# Patient Record
Sex: Male | Born: 1950 | Race: White | Hispanic: No | Marital: Single | Smoking: Never smoker
Health system: Southern US, Community
[De-identification: ages and names within clinical notes are randomized; demographics above are authoritative.]

## PROBLEM LIST (undated history)

## (undated) DIAGNOSIS — E119 Type 2 diabetes mellitus without complications: Secondary | ICD-10-CM

## (undated) DIAGNOSIS — E7800 Pure hypercholesterolemia, unspecified: Secondary | ICD-10-CM

## (undated) DIAGNOSIS — I10 Essential (primary) hypertension: Secondary | ICD-10-CM

---

## 2020-02-06 ENCOUNTER — Emergency Department (HOSPITAL_COMMUNITY): Payer: Medicare Other

## 2020-02-06 ENCOUNTER — Encounter (HOSPITAL_COMMUNITY): Payer: Self-pay | Admitting: *Deleted

## 2020-02-06 ENCOUNTER — Other Ambulatory Visit: Payer: Self-pay

## 2020-02-06 ENCOUNTER — Emergency Department (HOSPITAL_COMMUNITY)
Admission: EM | Admit: 2020-02-06 | Discharge: 2020-02-07 | Disposition: A | Discharge status: Other Acute Inpatient Hospital | Payer: Medicare Other | Attending: Emergency Medicine | Admitting: Emergency Medicine

## 2020-02-06 DIAGNOSIS — R600 Localized edema: Secondary | ICD-10-CM | POA: Diagnosis not present

## 2020-02-06 DIAGNOSIS — Z79899 Other long term (current) drug therapy: Secondary | ICD-10-CM | POA: Insufficient documentation

## 2020-02-06 DIAGNOSIS — Z9889 Other specified postprocedural states: Secondary | ICD-10-CM | POA: Diagnosis not present

## 2020-02-06 DIAGNOSIS — R509 Fever, unspecified: Secondary | ICD-10-CM | POA: Insufficient documentation

## 2020-02-06 DIAGNOSIS — E119 Type 2 diabetes mellitus without complications: Secondary | ICD-10-CM | POA: Diagnosis not present

## 2020-02-06 DIAGNOSIS — R112 Nausea with vomiting, unspecified: Secondary | ICD-10-CM | POA: Insufficient documentation

## 2020-02-06 DIAGNOSIS — R7989 Other specified abnormal findings of blood chemistry: Secondary | ICD-10-CM | POA: Insufficient documentation

## 2020-02-06 DIAGNOSIS — I10 Essential (primary) hypertension: Secondary | ICD-10-CM | POA: Insufficient documentation

## 2020-02-06 DIAGNOSIS — Z7984 Long term (current) use of oral hypoglycemic drugs: Secondary | ICD-10-CM | POA: Insufficient documentation

## 2020-02-06 DIAGNOSIS — Z20822 Contact with and (suspected) exposure to covid-19: Secondary | ICD-10-CM | POA: Insufficient documentation

## 2020-02-06 HISTORY — DX: Essential (primary) hypertension: I10

## 2020-02-06 HISTORY — DX: Pure hypercholesterolemia, unspecified: E78.00

## 2020-02-06 HISTORY — DX: Type 2 diabetes mellitus without complications: E11.9

## 2020-02-06 LAB — URINALYSIS, ROUTINE W REFLEX MICROSCOPIC
Bacteria, UA: NONE SEEN
Bilirubin Urine: NEGATIVE
Glucose, UA: >=500 mg/dL — AB
Hgb urine dipstick: NEGATIVE
Ketones, ur: 20 mg/dL — AB
Nitrite: NEGATIVE
Protein, ur: 30 mg/dL — AB
Specific Gravity, Urine: 1.015 (ref 1.005–1.030)
pH: 7 (ref 5.0–8.0)

## 2020-02-06 LAB — COMPREHENSIVE METABOLIC PANEL
ALT: 48 U/L — ABNORMAL HIGH (ref 0–44)
AST: 67 U/L — ABNORMAL HIGH (ref 15–41)
Albumin: 2.8 g/dL — ABNORMAL LOW (ref 3.5–5.0)
Alkaline Phosphatase: 118 U/L (ref 38–126)
Anion gap: 10 (ref 5–15)
BUN: 12 mg/dL (ref 8–23)
CO2: 28 mmol/L (ref 22–32)
Calcium: 8.5 mg/dL — ABNORMAL LOW (ref 8.9–10.3)
Chloride: 99 mmol/L (ref 98–111)
Creatinine, Ser: 1.07 mg/dL (ref 0.61–1.24)
GFR calc Af Amer: >60 mL/min (ref >60)
GFR calc non Af Amer: >60 mL/min (ref >60)
Glucose, Bld: 143 mg/dL — ABNORMAL HIGH (ref 70–99)
Potassium: 3.9 mmol/L (ref 3.5–5.1)
Sodium: 137 mmol/L (ref 135–145)
Total Bilirubin: 1.2 mg/dL (ref 0.3–1.2)
Total Protein: 6.8 g/dL (ref 6.5–8.1)

## 2020-02-06 LAB — CBC WITH DIFFERENTIAL/PLATELET
Abs Immature Granulocytes: 0.18 10*3/uL — ABNORMAL HIGH (ref 0.00–0.07)
Basophils Absolute: 0 10*3/uL (ref 0.0–0.1)
Basophils Relative: 0 %
Eosinophils Absolute: 0.2 10*3/uL (ref 0.0–0.5)
Eosinophils Relative: 1 %
HCT: 31.6 % — ABNORMAL LOW (ref 39.0–52.0)
Hemoglobin: 9.9 g/dL — ABNORMAL LOW (ref 13.0–17.0)
Immature Granulocytes: 1 %
Lymphocytes Relative: 4 %
Lymphs Abs: 0.6 10*3/uL — ABNORMAL LOW (ref 0.7–4.0)
MCH: 29.4 pg (ref 26.0–34.0)
MCHC: 31.3 g/dL (ref 30.0–36.0)
MCV: 93.8 fL (ref 80.0–100.0)
Monocytes Absolute: 1.3 10*3/uL — ABNORMAL HIGH (ref 0.1–1.0)
Monocytes Relative: 8 %
Neutro Abs: 13 10*3/uL — ABNORMAL HIGH (ref 1.7–7.7)
Neutrophils Relative %: 86 %
Platelets: 260 10*3/uL (ref 150–400)
RBC: 3.37 MIL/uL — ABNORMAL LOW (ref 4.22–5.81)
RDW: 14.3 % (ref 11.5–15.5)
WBC: 15.3 10*3/uL — ABNORMAL HIGH (ref 4.0–10.5)
nRBC: 0 % (ref 0.0–0.2)

## 2020-02-06 LAB — APTT: aPTT: 33 seconds (ref 24–36)

## 2020-02-06 LAB — PROTIME-INR
INR: 1 (ref 0.8–1.2)
Prothrombin Time: 12.9 seconds (ref 11.4–15.2)

## 2020-02-06 LAB — LACTIC ACID, PLASMA: Lactic Acid, Venous: 1.8 mmol/L (ref 0.5–1.9)

## 2020-02-06 MED ORDER — SODIUM CHLORIDE 0.9 % IV SOLN
2.0000 g | Freq: Three times a day (TID) | INTRAVENOUS | Status: DC
  Administered 2020-02-06 – 2020-02-07 (×2): 2 g via INTRAVENOUS
  Filled 2020-02-06 (×2): qty 2

## 2020-02-06 MED ORDER — ACETAMINOPHEN 650 MG RE SUPP
975.0000 mg | Freq: Once | RECTAL | Status: AC
  Administered 2020-02-06: 975 mg via RECTAL
  Filled 2020-02-06: qty 1

## 2020-02-06 MED ORDER — LACTATED RINGERS IV BOLUS
500.0000 mL | Freq: Once | INTRAVENOUS | Status: AC
  Administered 2020-02-06: 500 mL via INTRAVENOUS

## 2020-02-06 MED ORDER — SODIUM CHLORIDE 0.9 % IV SOLN
2.0000 g | Freq: Once | INTRAVENOUS | Status: AC
  Administered 2020-02-06: 2 g via INTRAVENOUS
  Filled 2020-02-06: qty 2

## 2020-02-06 MED ORDER — ACETAMINOPHEN 500 MG PO TABS
1000.0000 mg | ORAL_TABLET | Freq: Once | ORAL | Status: DC
  Filled 2020-02-06: qty 2

## 2020-02-06 MED ORDER — FENTANYL CITRATE (PF) 100 MCG/2ML IJ SOLN
50.0000 ug | Freq: Once | INTRAMUSCULAR | Status: AC
  Administered 2020-02-06: 50 ug via INTRAVENOUS
  Filled 2020-02-06: qty 2

## 2020-02-06 MED ORDER — ONDANSETRON HCL 4 MG/2ML IJ SOLN
4.0000 mg | Freq: Once | INTRAMUSCULAR | Status: AC
  Administered 2020-02-06: 15:00:00 4 mg via INTRAVENOUS
  Filled 2020-02-06: qty 2

## 2020-02-06 NOTE — ED Notes (Signed)
Pt transported to [facility]

## 2020-02-06 NOTE — ED Provider Notes (Signed)
4:57 PM Care assumed from Dr. Clarice Pole.  At time of transfer of care, patient is awaiting results of CT abdomen and pelvis without contrast to look for a Po surgical complication leading to the patient's sepsis.  The previous provider, Dr. Clarice Pole, spoke with the urology team at [facility] and they feel that unless there is a surgical issue, he would likely be stable for admission at this facility for further management of his sepsis instead of transfer.  If there is a surgical problem or abscess seen, they requested to be called to the transfer line to discuss further disposition.  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  Anticipate reassessment after imaging results.  CT scan showed expected fluid collection and postsurgical changes in the abdomen and retroperitoneal space.  Chest x-ray did not show pneumonia and urinalysis not convincing for pneumonia.  Patient received antibiotics already for sepsis.  I called [facility] and spoke to the urologist, Dr. Jerelyn Charles who reviewed the case.  He feels that since the patient recently had surgery and is septic, they would accept the patient in transfer.  I had a discussion with the patient about being admitted here versus transfer to [facility] and the patient would rather be transferred to be admitted by the surgical team.  Urology will except the patient at [facility] and we are awaiting a bed.  Due to elevated liver function, a right upper quadrant ultrasound was ordered.  It revealed no evidence of cholelithiasis or gallbladder disease.  Hepatic steatosis was discovered.  Patient awaiting transfer and admission to [facility] urology service for further management of sepsis in the postoperative setting after recent right nephrectomy.  10:58 PM Just received word that patient has a bed at [facility].  Will fill out documentation for patient to be transferred.   Clinical Impression: 1. Fever, unspecified  fever cause   2. LFT elevation   3. Non-intractable vomiting with nausea, unspecified vomiting type     Disposition: Admit and transfer to [facility] for urology admission for sepsis.  Awaiting confirmation for transportation before EMTALA documentation will be filled out.  Patient appears well on reassessment.   This note was prepared with assistance of Conservation officer, historic buildings. Occasional wrong-word or sound-a-like substitutions may have occurred due to the inherent limitations of voice recognition software.      Kambrey Hagger, Canary Brim, MD 02/06/20 2258

## 2020-02-06 NOTE — ED Notes (Signed)
[facility] Transfer called for Urology per Dr. Sharee Holster request

## 2020-02-06 NOTE — ED Triage Notes (Addendum)
Pt here via [facility] from home. Received emergency surgery at [facility] on 3/25 for removal of L kidney r/t tumor that was causing sepsis.  Pt thinks he has been home a week.  He has been ambulatory, eating/drinking, urinating and having bm's without difficulty.  This am he began experiencing chills and feeling sob when in a supine position.  Temp per ems was 102.7, hr 104, cbg 113, bp 178/72.  Given 650 tylenol en-route, which he vomited up upon arrival.

## 2020-02-06 NOTE — Progress Notes (Addendum)
Pharmacy Antibiotic Note  Ian Branch is a 69 y.o. male admitted on 02/06/2020 with sepsis 2/2 UTI. 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 Pt is febrile (Tm 102.3), elevated WBC at 15.3, lactate 1.8, Scr wnl, cultures pending. Pharmacy has been consulted for Cefepime dosing.   Plan: - Cefepime 2g IV Q8 hrs - Monitor renal function, cultures/sensitivities, clinical progression - F/u LOT  Height: 6\' 1"  (185.4 cm) Weight: (!) 139.7 kg (308 lb) IBW/kg (Calculated) : 79.9  Temp (24hrs), Avg:102.3 F (39.1 C), Min:102.3 F (39.1 C), Max:102.3 F (39.1 C)  No results for input(s): WBC, CREATININE, LATICACIDVEN, VANCOTROUGH, VANCOPEAK, VANCORANDOM, GENTTROUGH, GENTPEAK, GENTRANDOM, TOBRATROUGH, TOBRAPEAK, TOBRARND, AMIKACINPEAK, AMIKACINTROU, AMIKACIN in the last 168 hours.  CrCl cannot be calculated (No successful lab value found.).    No Known Allergies  Antimicrobials this admission: Cefepime 4/1 >>   Dose adjustments this admission: N/A  Microbiology results: 4/2 BCx: sent 4/2 UCx:   6/2, PharmD PGY1 Pharmacy Resident Phone: [phone] 02/06/2020  3:04 PM  Please check [email or web address] for unit-specific pharmacy phone numbers.

## 2020-02-06 NOTE — ED Provider Notes (Signed)
[facility] EMERGENCY DEPARTMENT Provider Note   CSN: [id number] Arrival date & time: 02/06/20  1414     History Chief Complaint  Patient presents with  . Fever  . Shortness of Breath    Ian Branch is a 69 y.o. male.  HPI Patient was just discharged from [facility] day before yesterday after complicated admission for sepsis and renal surgery with left renal tumor excision.  He was not discharged on antibiotics.  He reports he was feeling all right and very abruptly today he started multiple episodes of vomiting and developed a fever up to 102 and got chills.  He denies he has any increased pain in his abdomen.  He has similar discomfort for which she has been taking pain medications but no abrupt increase in abdominal discomfort.  He reports he did start to feel short of breath when he got the fever and started vomiting.  He denies he is having any chest pain.  He does have swelling in both legs.  He reports is slightly more than typical for him.  He does not have calf pain.  No confusion or headache.  He feels extremely weak and fatigued again.  Patient had Covid testing which has been negative to date.    Past Medical History:  Diagnosis Date  . Diabetes mellitus without complication (HCC)    Type II  . Hypercholesteremia   . Hypertension     There are no problems to display for this patient.        No family history on file.  Social History   Tobacco Use  . Smoking status: Not on file  Substance Use Topics  . Alcohol use: Not on file  . Drug use: Not on file    Home Medications Prior to Admission medications   Medication Sig Start Date End Date Taking? Authorizing Provider  acetaminophen (TYLENOL) 500 MG tablet Take 500 mg by mouth every 6 (six) hours as needed for moderate pain or fever.   Yes [provider]  amLODipine (NORVASC) 2.5 MG tablet Take 2.5 mg by mouth daily.   Yes [provider]  atorvastatin (LIPITOR) 20 MG tablet  Take 20 mg by mouth daily.   Yes [provider]  dapagliflozin propanediol (FARXIGA) 5 MG TABS tablet Take 5 mg by mouth daily.   Yes [provider]  enoxaparin (LOVENOX) 40 MG/0.4ML injection Inject 40 mg into the skin daily.   Yes [provider]  ezetimibe (ZETIA) 10 MG tablet Take 10 mg by mouth daily.   Yes [provider]  gabapentin (NEURONTIN) 100 MG capsule Take 100 mg by mouth 3 (three) times daily as needed (pain).   Yes [provider]  losartan (COZAAR) 25 MG tablet Take 25 mg by mouth daily.   Yes [provider]  metFORMIN (GLUCOPHAGE) 1000 MG tablet Take 1,000 mg by mouth 2 (two) times daily with a meal.   Yes [provider]  oxyCODONE (OXY IR/ROXICODONE) 5 MG immediate release tablet Take 5 mg by mouth every 6 (six) hours as needed for severe pain.   Yes [provider]  polyethylene glycol (MIRALAX / GLYCOLAX) 17 g packet Take 17 g by mouth daily as needed for moderate constipation.   Yes [provider]  sennosides-docusate sodium (SENOKOT-S) 8.6-50 MG tablet Take 2 tablets by mouth in the morning and at bedtime.   Yes [provider]    Allergies    Patient has no known allergies.  Review of Systems   Review of Systems 10 Systems reviewed and are negative for acute change except as noted in the HPI.  Physical Exam Updated Vital Signs BP (!) 155/71   Pulse (!) 39   Temp (!) 102.3 F (39.1 C) (Oral)   Resp 18   Ht 6\' 1"  (1.854 m)   Wt (!) 139.7 kg   SpO2 98%   BMI 40.64 kg/m   Physical Exam Constitutional:      Appearance: He is well-developed.     Comments: Patient is alert.  Mental status is clear.  No respiratory distress.  He does appear moderately ill.  HENT:     Head: Normocephalic and atraumatic.     Mouth/Throat:     Mouth: Mucous membranes are moist.     Pharynx: Oropharynx is clear.  Eyes:     Extraocular Movements: Extraocular movements intact.      Conjunctiva/sclera: Conjunctivae normal.  Cardiovascular:     Rate and Rhythm: Normal rate and regular rhythm.     Heart sounds: Normal heart sounds.     Comments: Borderline tachycardia. Pulmonary:     Effort: Pulmonary effort is normal.     Breath sounds: Normal breath sounds.  Abdominal:     General: Bowel sounds are normal. There is no distension.     Palpations: Abdomen is soft.     Tenderness: There is abdominal tenderness.     Comments: Patient has surgical scars anteriorly that are healing well on the abdomen.  Abdomen is soft.  Patient does not have guarding.  Mild discomfort to deeper palpation.  Musculoskeletal:        General: Normal range of motion.     Cervical back: Neck supple.     Comments: Patient has 1+ edema bilateral ankles.  Calves are soft and nontender.  Skin condition of the lower legs is good.  Skin:    General: Skin is warm and dry.  Neurological:     Mental Status: He is oriented to person, place, and time.     GCS: GCS eye subscore is 4. GCS verbal subscore is 5. GCS motor subscore is 6.     Coordination: Coordination normal.     ED Results / Procedures / Treatments   Labs (all labs ordered are listed, but only abnormal results are displayed) Labs Reviewed  COMPREHENSIVE METABOLIC PANEL - Abnormal; Notable for the following components:      Result Value   Glucose, Bld 143 (*)    Calcium 8.5 (*)    Albumin 2.8 (*)    AST 67 (*)    ALT 48 (*)    All other components within normal limits  CBC WITH DIFFERENTIAL/PLATELET - Abnormal; Notable for the following components:   WBC 15.3 (*)    RBC 3.37 (*)    Hemoglobin 9.9 (*)    HCT 31.6 (*)    Neutro Abs 13.0 (*)    Lymphs Abs 0.6 (*)    Monocytes Absolute 1.3 (*)    Abs Immature Granulocytes 0.18 (*)    All other components within normal limits  URINALYSIS, ROUTINE W REFLEX MICROSCOPIC - Abnormal; Notable for the following components:   Glucose, UA >=500 (*)    Ketones, ur 20 (*)    Protein, ur  30 (*)    Leukocytes,Ua TRACE (*)    All other components within normal limits  CULTURE, BLOOD (ROUTINE X 2)  CULTURE, BLOOD (ROUTINE X 2)  URINE CULTURE  LACTIC ACID, PLASMA  PROTIME-INR  APTT  LACTIC ACID, PLASMA    EKG None  Radiology DG Chest Portable 1 View  Result Date: 02/06/2020 CLINICAL DATA:  69 year old male with a history of shortness of breath EXAM: PORTABLE CHEST 1 VIEW COMPARISON:  None. FINDINGS: Cardiomediastinal silhouette borderline enlarged accentuated by apical lordosis. No pneumothorax or pleural effusion. No confluent airspace disease. No displaced fracture IMPRESSION: Negative for acute cardiopulmonary disease Electronically Signed   By: Corrie Mckusick D.O.   On: 02/06/2020 15:03    Procedures Procedures (including critical care time) CRITICAL CARE Performed by: Charlesetta Shanks   Total critical care time: 30 minutes  Critical care time was exclusive of separately billable procedures and treating other patients.  Critical care was necessary to treat or prevent imminent or life-threatening deterioration.  Critical care was time spent personally by me on the following activities: development of treatment plan with patient and/or surrogate as well as nursing, discussions with consultants, evaluation of patient's response to treatment, examination of patient, obtaining history from patient or surrogate, ordering and performing treatments and interventions, ordering and review of laboratory studies, ordering and review of radiographic studies, pulse oximetry and re-evaluation of patient's condition. Medications Ordered in ED Medications  ceFEPIme (MAXIPIME) 2 g in sodium chloride 0.9 % 100 mL IVPB (has no administration in time range)  ceFEPIme (MAXIPIME) 2 g in sodium chloride 0.9 % 100 mL IVPB (2 g Intravenous New Bag/Given 02/06/20 1507)  lactated ringers bolus 500 mL (500 mLs Intravenous New Bag/Given 02/06/20 1515)  ondansetron (ZOFRAN) injection 4 mg (4 mg  Intravenous Given 02/06/20 1511)  acetaminophen (TYLENOL) suppository 975 mg (975 mg Rectal Given 02/06/20 1511)    ED Course  I have reviewed the triage vital signs and the nursing notes.  Pertinent labs & imaging results that were available during my care of the patient were reviewed by me and considered in my medical decision making (see chart for details).    MDM Rules/Calculators/A&P                      Consult: Reviewed with Dr. Jennye Boroughs urology fellow.  He was aware of the case and recommends to proceed with CT abdomen pelvis.  If no urgent or emergent surgical issues identified, patient can remain at [facility] for treatment.  Patient presents as outlined above.  He has had significant renal surgery and sepsis within the past 2 weeks.  Acute onset of fever and vomiting again today.  We will proceed with septic treatment and work-up.  Patient's blood pressures however are hypertensive backslash normotensive.  He does have peripheral edema of the lower extremities.  Heart rate is only mildly elevated at 104.  At this time will initiate conservative fluid bolus at 500 cc LR.  Currently do not feel that the patient needs 30 cc/kg sepsis bolus, will continue to observe closely and review results to determine necessity of full fluid bolus.  Patient's mental status is clear.  Dr. Gustavus Messing will follow up on the CT results and continue to monitor and make final disposition. Final Clinical Impression(s) / ED Diagnoses Final diagnoses:  None    Rx / DC Orders ED Discharge Orders    None       Charlesetta Shanks, MD 02/06/20 1644

## 2020-02-06 NOTE — ED Notes (Signed)
Robin McCants, patient's sister would like a call to come back once patient is checked in. [phone]

## 2020-02-07 DIAGNOSIS — R112 Nausea with vomiting, unspecified: Secondary | ICD-10-CM | POA: Diagnosis not present

## 2020-02-07 LAB — RESPIRATORY PANEL BY RT PCR (FLU A&B, COVID)
Influenza A by PCR: NEGATIVE
Influenza B by PCR: NEGATIVE
SARS Coronavirus 2 by RT PCR: NEGATIVE

## 2020-02-07 LAB — CBG MONITORING, ED: Glucose-Capillary: 106 mg/dL — ABNORMAL HIGH (ref 70–99)

## 2020-02-07 MED ORDER — FENTANYL CITRATE (PF) 100 MCG/2ML IJ SOLN
50.0000 ug | Freq: Once | INTRAMUSCULAR | Status: AC
  Administered 2020-02-07: 50 ug via INTRAVENOUS
  Filled 2020-02-07: qty 2

## 2020-02-07 MED ORDER — MORPHINE SULFATE (PF) 4 MG/ML IV SOLN
4.0000 mg | Freq: Once | INTRAVENOUS | Status: AC
  Administered 2020-02-07: 4 mg via INTRAVENOUS
  Filled 2020-02-07: qty 1

## 2020-02-07 NOTE — ED Notes (Signed)
Jorene Guest sister [phone] would like to be called when patient is transported

## 2020-02-07 NOTE — ED Notes (Signed)
This RN was informed by [facility] Dispatch that transport was on the way for this pt; COVID Swab will be collected to expedite process.

## 2020-02-07 NOTE — ED Notes (Signed)
This RN was informed by Lilli, EMT of patients drop in oxygenation once pt was asleep. Pt was placed on 2L . Pt is stable and is now resting.

## 2020-02-08 LAB — URINE CULTURE: Culture: 50000 — AB

## 2020-02-11 LAB — CULTURE, BLOOD (ROUTINE X 2)
Culture: NO GROWTH
Culture: NO GROWTH
Special Requests: ADEQUATE

## 2021-08-28 IMAGING — US US ABDOMEN LIMITED
1 series · 14 of 25 positions shown · non-contrast
Comparison: CT 02/06/2020

CLINICAL DATA: Elevated LFT

EXAM:
ULTRASOUND ABDOMEN LIMITED RIGHT UPPER QUADRANT

[Series 1: us abdomen limited ruq · 14 of 47 slices shown]
[im 1/47]
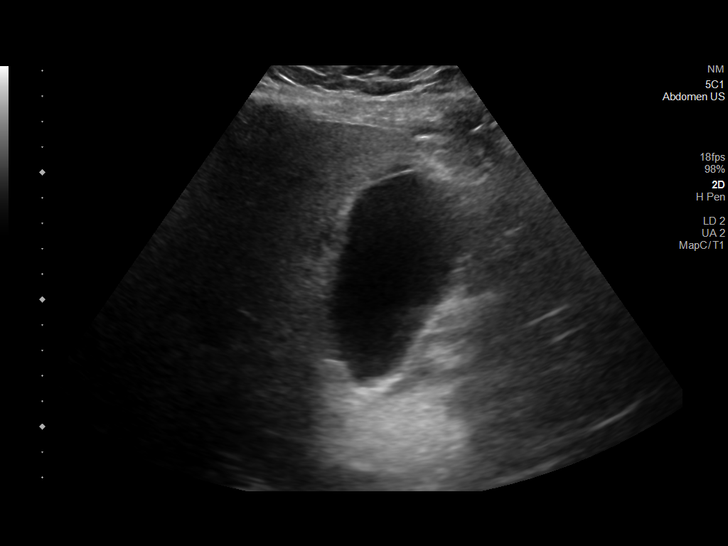
[im 4/47]
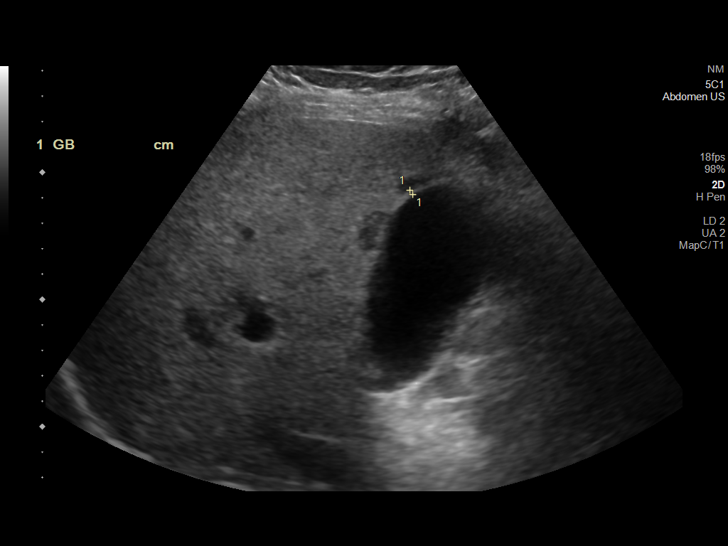
[im 8/47]
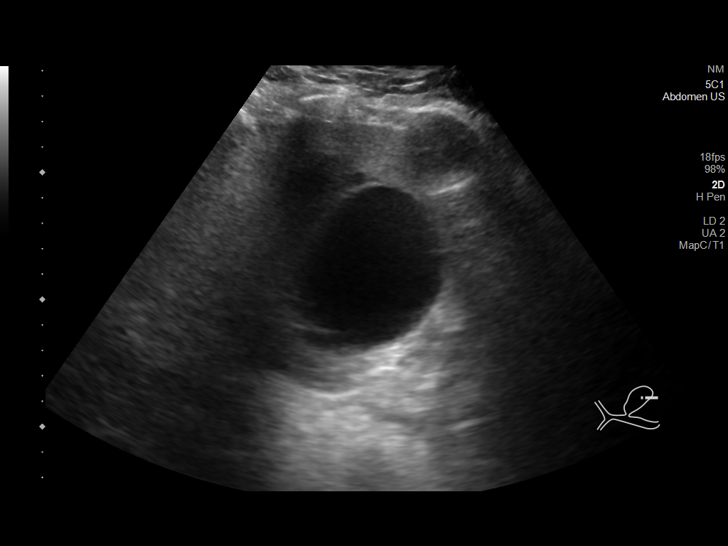
[im 12/47]
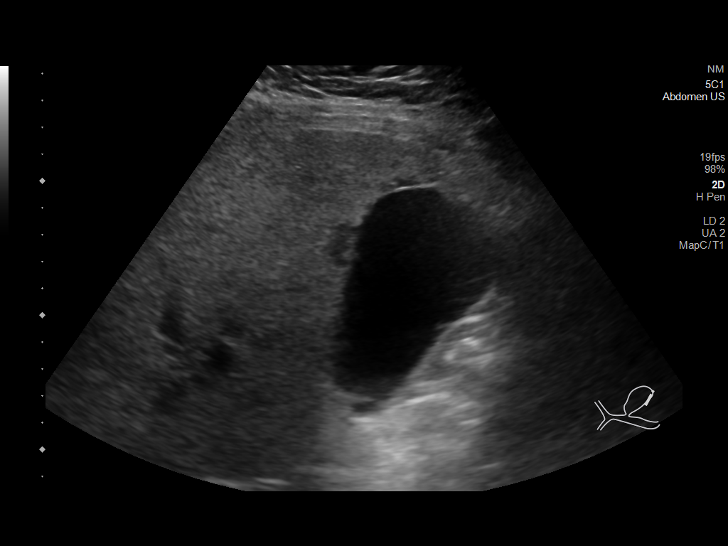
[im 16/47]
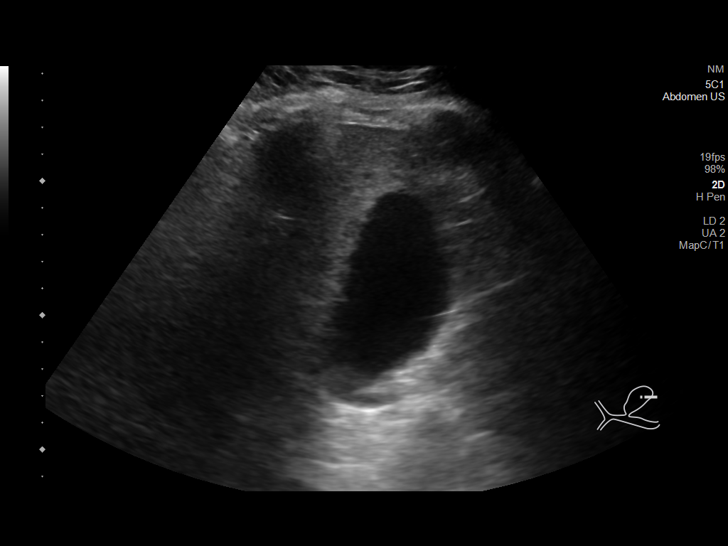
[im 18/47]
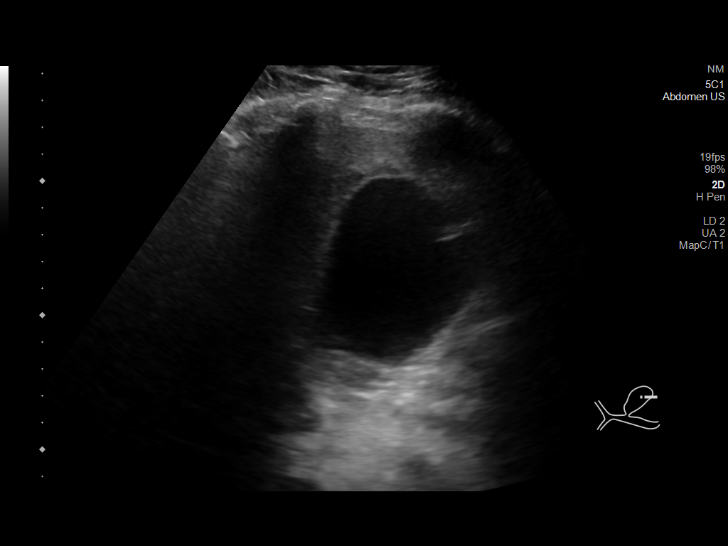
[im 22/47]
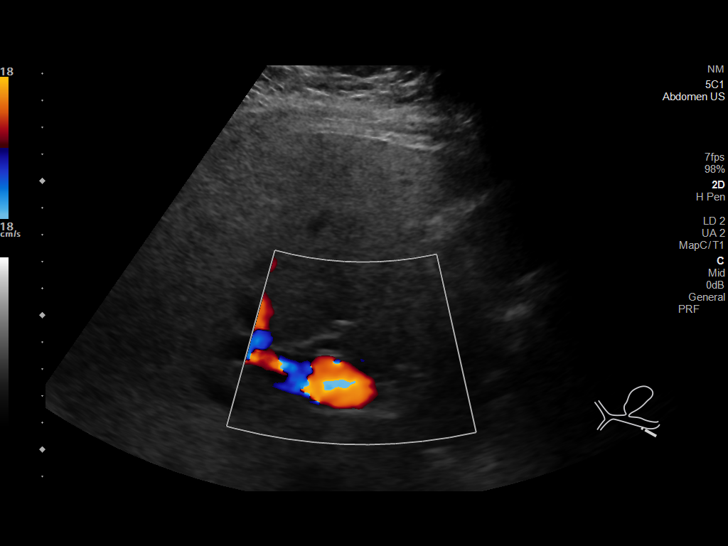
[im 25/47]
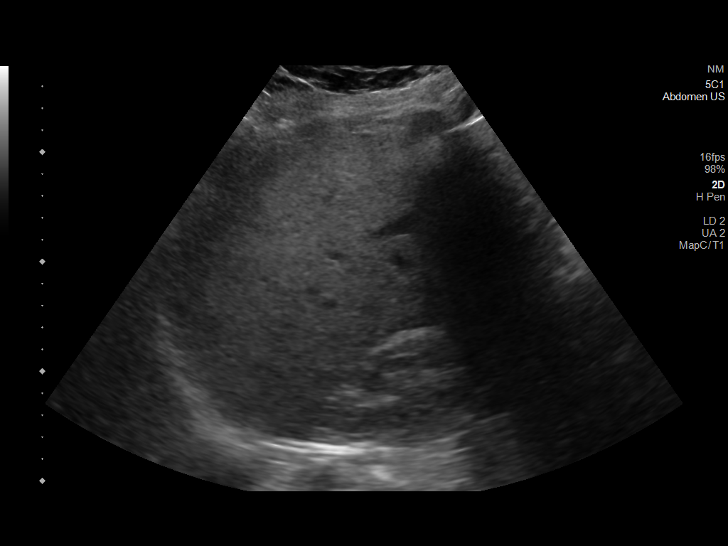
[im 29/47]
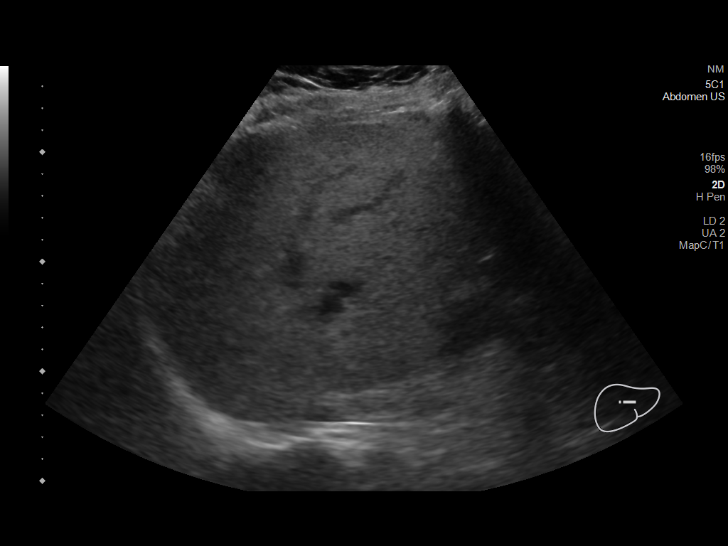
[im 31/47]
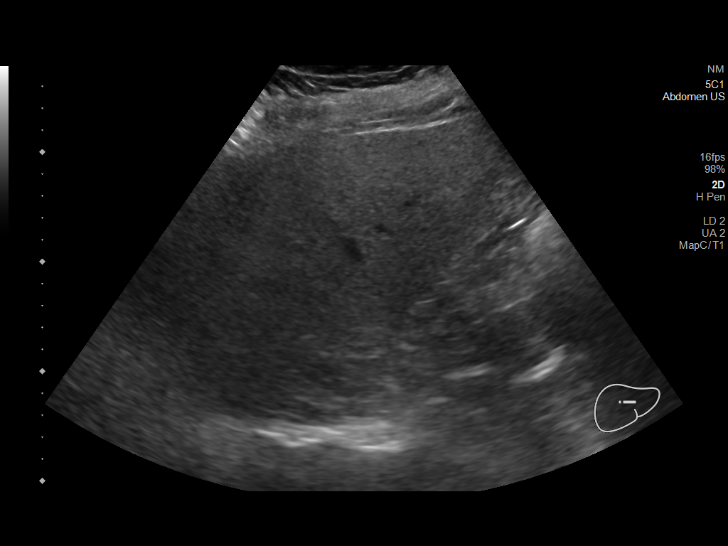
[im 35/47]
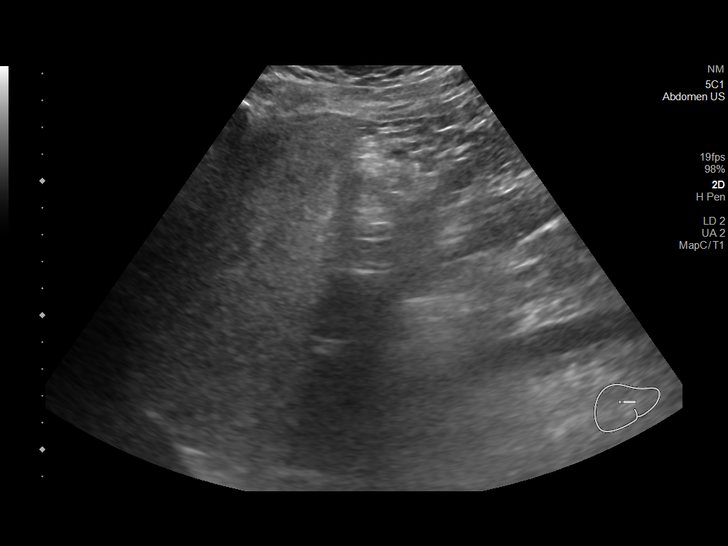
[im 39/47]
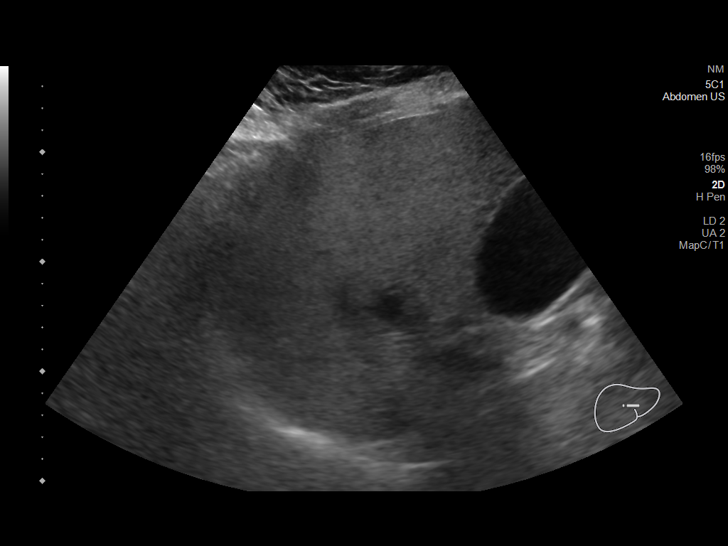
[im 43/47]
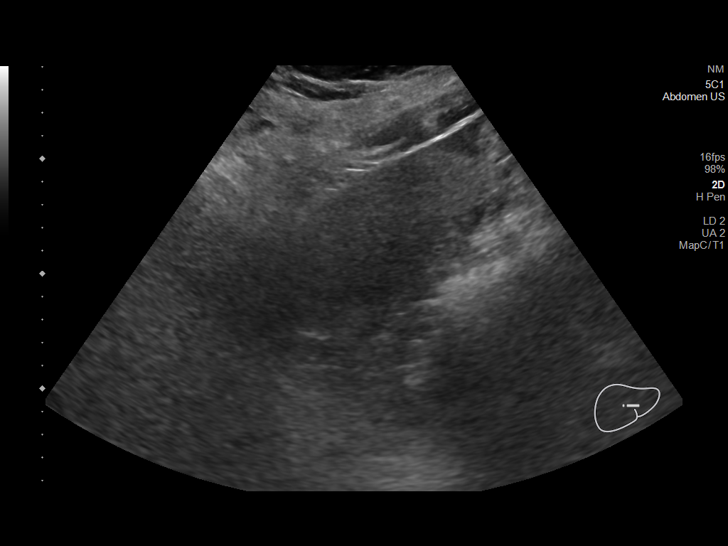
[im 47/47]
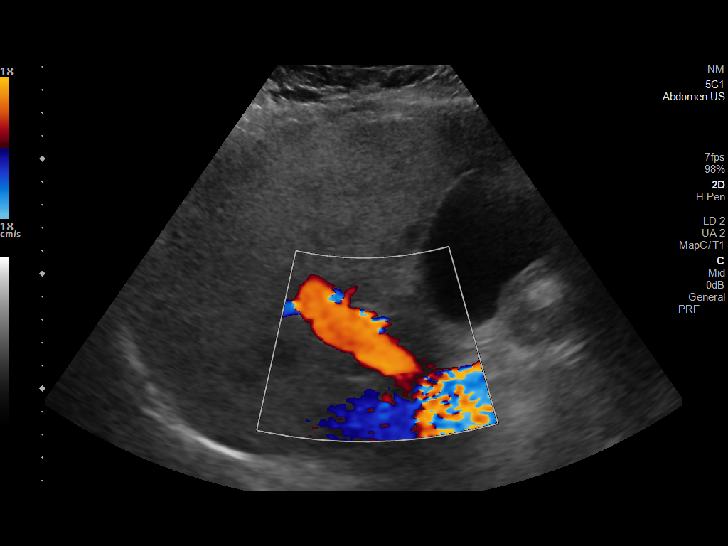

[14 of 25 positions shown; findings below may reference images not displayed]

FINDINGS: Gallbladder:

No gallstones or wall thickening visualized. No sonographic Murphy
sign noted by sonographer. Slightly distended.

Common bile duct:

Diameter: 5.4 mm

Liver:

Liver is echogenic. Small hypoechoic areas near the gallbladder
fossa, likely fat sparing. Portal vein is patent on color Doppler
imaging with normal direction of blood flow towards the liver.

Other: None.
IMPRESSION: 1. Negative for gallstones or acute gallbladder disease
2. Echogenic liver consistent with steatosis. Probable fat sparing
near the gallbladder fossa

## 2022-07-22 ENCOUNTER — Other Ambulatory Visit: Payer: Self-pay

## 2022-07-22 ENCOUNTER — Emergency Department (HOSPITAL_BASED_OUTPATIENT_CLINIC_OR_DEPARTMENT_OTHER)
Admission: EM | Admit: 2022-07-22 | Discharge: 2022-07-22 | Disposition: A | Discharge status: Home / Self Care | Payer: Medicare Other | Attending: Emergency Medicine | Admitting: Emergency Medicine

## 2022-07-22 ENCOUNTER — Encounter (HOSPITAL_BASED_OUTPATIENT_CLINIC_OR_DEPARTMENT_OTHER): Payer: Self-pay

## 2022-07-22 DIAGNOSIS — E860 Dehydration: Secondary | ICD-10-CM | POA: Insufficient documentation

## 2022-07-22 DIAGNOSIS — E878 Other disorders of electrolyte and fluid balance, not elsewhere classified: Secondary | ICD-10-CM | POA: Insufficient documentation

## 2022-07-22 DIAGNOSIS — Z20822 Contact with and (suspected) exposure to covid-19: Secondary | ICD-10-CM | POA: Diagnosis not present

## 2022-07-22 DIAGNOSIS — R1111 Vomiting without nausea: Secondary | ICD-10-CM

## 2022-07-22 DIAGNOSIS — R531 Weakness: Secondary | ICD-10-CM | POA: Diagnosis present

## 2022-07-22 LAB — RESP PANEL BY RT-PCR (FLU A&B, COVID) ARPGX2
Influenza A by PCR: NEGATIVE
Influenza B by PCR: NEGATIVE
SARS Coronavirus 2 by RT PCR: NEGATIVE

## 2022-07-22 LAB — CBC
HCT: 46.5 % (ref 39.0–52.0)
Hemoglobin: 15.4 g/dL (ref 13.0–17.0)
MCH: 29.4 pg (ref 26.0–34.0)
MCHC: 33.1 g/dL (ref 30.0–36.0)
MCV: 88.9 fL (ref 80.0–100.0)
Platelets: 234 10*3/uL (ref 150–400)
RBC: 5.23 MIL/uL (ref 4.22–5.81)
RDW: 13.9 % (ref 11.5–15.5)
WBC: 8.6 10*3/uL (ref 4.0–10.5)
nRBC: 0 % (ref 0.0–0.2)

## 2022-07-22 LAB — BASIC METABOLIC PANEL
Anion gap: 14 (ref 5–15)
BUN: 22 mg/dL (ref 8–23)
CO2: 27 mmol/L (ref 22–32)
Calcium: 9.6 mg/dL (ref 8.9–10.3)
Chloride: 97 mmol/L — ABNORMAL LOW (ref 98–111)
Creatinine, Ser: 1.16 mg/dL (ref 0.61–1.24)
GFR, Estimated: >60 mL/min (ref >60)
Glucose, Bld: 213 mg/dL — ABNORMAL HIGH (ref 70–99)
Potassium: 3.5 mmol/L (ref 3.5–5.1)
Sodium: 138 mmol/L (ref 135–145)

## 2022-07-22 LAB — URINALYSIS, ROUTINE W REFLEX MICROSCOPIC
Bilirubin Urine: NEGATIVE
Glucose, UA: >1000 mg/dL — AB
Hgb urine dipstick: NEGATIVE
Ketones, ur: 15 mg/dL — AB
Leukocytes,Ua: NEGATIVE
Nitrite: NEGATIVE
Protein, ur: 30 mg/dL — AB
Specific Gravity, Urine: 1.033 — ABNORMAL HIGH (ref 1.005–1.030)
pH: 5.5 (ref 5.0–8.0)

## 2022-07-22 LAB — CBG MONITORING, ED: Glucose-Capillary: 204 mg/dL — ABNORMAL HIGH (ref 70–99)

## 2022-07-22 MED ORDER — SODIUM CHLORIDE 0.9 % IV BOLUS
1000.0000 mL | Freq: Once | INTRAVENOUS | Status: AC
  Administered 2022-07-22: 1000 mL via INTRAVENOUS

## 2022-07-22 NOTE — ED Provider Notes (Signed)
[facility] EMERGENCY DEPT Provider Note   CSN: [id number] Arrival date & time: 07/22/22  1249     History Chief Complaint  Patient presents with   Weakness    Ian Branch is a 71 y.o. male patient who presents to the emergency department today for further evaluation of nausea and vomiting over the last 48 hours.  Patient was recently seen evaluated his primary care doctor where he had a hemoglobin A1c of 11.  This prompted his primary care doctor to increase his Ozempic from 1 to 2 mg.  He took his first injection of the new dose on Wednesday and he woke up Thursday morning having intractable nausea and vomiting for 2 days.  Today, he is able to tolerate p.o. solids and fluids without any vomiting.  Patient is supposed to follow-up with his nephrology oncology team secondary to kidney cancer on Tuesday and was instructed to come into the emergency department for further evaluation.  He denies any vomiting today, fever, chills, abdominal pain.   Weakness      Home Medications Prior to Admission medications   Medication Sig Start Date End Date Taking? Authorizing Provider  acetaminophen (TYLENOL) 500 MG tablet Take 500 mg by mouth every 6 (six) hours as needed for moderate pain or fever.    [provider]  amLODipine (NORVASC) 2.5 MG tablet Take 2.5 mg by mouth daily.    [provider]  atorvastatin (LIPITOR) 20 MG tablet Take 20 mg by mouth daily.    [provider]  dapagliflozin propanediol (FARXIGA) 5 MG TABS tablet Take 5 mg by mouth daily.    [provider]  enoxaparin (LOVENOX) 40 MG/0.4ML injection Inject 40 mg into the skin daily.    [provider]  ezetimibe (ZETIA) 10 MG tablet Take 10 mg by mouth daily.    [provider]  gabapentin (NEURONTIN) 100 MG capsule Take 100 mg by mouth 3 (three) times daily as needed (pain).    [provider]  losartan (COZAAR) 25 MG tablet Take 25 mg by mouth  daily.    [provider]  metFORMIN (GLUCOPHAGE) 1000 MG tablet Take 1,000 mg by mouth 2 (two) times daily with a meal.    [provider]  oxyCODONE (OXY IR/ROXICODONE) 5 MG immediate release tablet Take 5 mg by mouth every 6 (six) hours as needed for severe pain.    [provider]  polyethylene glycol (MIRALAX / GLYCOLAX) 17 g packet Take 17 g by mouth daily as needed for moderate constipation.    [provider]  sennosides-docusate sodium (SENOKOT-S) 8.6-50 MG tablet Take 2 tablets by mouth in the morning and at bedtime.    [provider]      Allergies    Patient has no known allergies.    Review of Systems   Review of Systems  Neurological:  Positive for weakness.  All other systems reviewed and are negative.   Physical Exam Updated Vital Signs BP 133/73   Pulse (!) 57   Temp 98.3 F (36.8 C)   Resp 17   Ht 6\' 1"  (1.854 m)   Wt (!) 154.2 kg   SpO2 95%   BMI 44.86 kg/m  Physical Exam Vitals and nursing note reviewed.  Constitutional:      General: He is not in acute distress.    Appearance: Normal appearance. He is obese.  HENT:     Head: Normocephalic and atraumatic.  Eyes:     General:  Right eye: No discharge.        Left eye: No discharge.  Cardiovascular:     Comments: Regular rate and rhythm.  S1/S2 are distinct without any evidence of murmur, rubs, or gallops.  Radial pulses are 2+ bilaterally.  Dorsalis pedis pulses are 2+ bilaterally.  No evidence of pedal edema. Pulmonary:     Comments: Clear to auscultation bilaterally.  Normal effort.  No respiratory distress.  No evidence of wheezes, rales, or rhonchi heard throughout. Abdominal:     General: Abdomen is flat. Bowel sounds are normal. There is no distension.     Tenderness: There is no abdominal tenderness. There is no guarding or rebound.  Musculoskeletal:        General: Normal range of motion.     Cervical back: Neck supple.  Skin:    General:  Skin is warm and dry.     Findings: No rash.  Neurological:     General: No focal deficit present.     Mental Status: He is alert.  Psychiatric:        Mood and Affect: Mood normal.        Behavior: Behavior normal.     ED Results / Procedures / Treatments   Labs (all labs ordered are listed, but only abnormal results are displayed) Labs Reviewed  BASIC METABOLIC PANEL - Abnormal; Notable for the following components:      Result Value   Chloride 97 (*)    Glucose, Bld 213 (*)    All other components within normal limits  URINALYSIS, ROUTINE W REFLEX MICROSCOPIC - Abnormal; Notable for the following components:   Specific Gravity, Urine 1.033 (*)    Glucose, UA >1,000 (*)    Ketones, ur 15 (*)    Protein, ur 30 (*)    All other components within normal limits  CBG MONITORING, ED - Abnormal; Notable for the following components:   Glucose-Capillary 204 (*)    All other components within normal limits  RESP PANEL BY RT-PCR (FLU A&B, COVID) ARPGX2  CBC  CBG MONITORING, ED    EKG None  Radiology No results found.  Procedures Procedures    Medications Ordered in ED Medications  sodium chloride 0.9 % bolus 1,000 mL (1,000 mLs Intravenous New Bag/Given 07/22/22 1630)    ED Course/ Medical Decision Making/ A&P Clinical Course as of 07/22/22 1833  Sat Jul 22, 2022  1609 CBC Normal. [CF]  1610 Basic metabolic panel(!) Hypochloremia with a normal anion gap. [CF]  1609 Resp Panel by RT-PCR (Flu A&B, Covid) Urine, Clean Catch Normal. [CF]  1609 Urinalysis, Routine w reflex microscopic Urine, Clean Catch(!) Glucosuria and some ketones but no signs of infection. [CF]  1609 CBG monitoring, ED(!) Elevated. [CF]    Clinical Course User Index [CF] Hendricks Limes, PA-C                           Medical Decision Making Ian Branch is a 70 y.o. male patient who presents to the emergency department today for further evaluation of nausea and vomiting.  I suspect this  is likely secondary to the increase in Ozempic dose.  I will check some labs and electrolytes.  I have a low suspicion for DKA at this time.  I will plan to get him rehydrated with fluids and plan to reassess.  No evidence of DKA today.  Anion gap is normal.  Patient feeling better after fluids.  He  is safe for discharge.  I will have him call his primary care doctor on Monday to see if they want to keep him in his current Ozempic dose or dropping back down.  Amount and/or Complexity of Data Reviewed Labs: ordered. Decision-making details documented in ED Course.    Final Clinical Impression(s) / ED Diagnoses Final diagnoses:  Dehydration  Vomiting without nausea, unspecified vomiting type    Rx / DC Orders ED Discharge Orders     None         Teressa Lower, [place] 07/22/22 1833    Terald Sleeper, MD 07/23/22 1045

## 2022-07-22 NOTE — Discharge Instructions (Signed)
Call your primary care doctor on Monday to establish whether or not you should stay at the current Ozempic dose or drop back down.  As long as you can tolerate fluids by mouth you should be in good shape.  Keep an eye on your sugars.  Please return to the emergency department for any worsening symptoms you might have.  Keep your appointment with your [facility] team on Tuesday.

## 2022-07-22 NOTE — ED Triage Notes (Signed)
Pt states that he was recently increased on Ozempic from 1mg  to 2mg  1.5 weeks ago. Pt states that he had emesis x 2 days. Pt states his sugar has been all over the place. Pt endorses brain fogginess. Pt endorses headache and fatigue.

## 2022-07-22 NOTE — ED Notes (Signed)
Dc instructions reviewed with patient. Patient voiced understanding. Dc with belongings.  °

## 2022-09-30 ENCOUNTER — Other Ambulatory Visit: Payer: Self-pay

## 2022-09-30 ENCOUNTER — Emergency Department (HOSPITAL_BASED_OUTPATIENT_CLINIC_OR_DEPARTMENT_OTHER): Payer: Medicare Other

## 2022-09-30 ENCOUNTER — Emergency Department (HOSPITAL_BASED_OUTPATIENT_CLINIC_OR_DEPARTMENT_OTHER)
Admission: EM | Admit: 2022-09-30 | Discharge: 2022-09-30 | Disposition: A | Discharge status: Home / Self Care | Payer: Medicare Other | Attending: Emergency Medicine | Admitting: Emergency Medicine

## 2022-09-30 DIAGNOSIS — I10 Essential (primary) hypertension: Secondary | ICD-10-CM | POA: Diagnosis not present

## 2022-09-30 DIAGNOSIS — Z79899 Other long term (current) drug therapy: Secondary | ICD-10-CM | POA: Diagnosis not present

## 2022-09-30 DIAGNOSIS — Z7984 Long term (current) use of oral hypoglycemic drugs: Secondary | ICD-10-CM | POA: Diagnosis not present

## 2022-09-30 DIAGNOSIS — E119 Type 2 diabetes mellitus without complications: Secondary | ICD-10-CM | POA: Diagnosis not present

## 2022-09-30 DIAGNOSIS — K047 Periapical abscess without sinus: Secondary | ICD-10-CM | POA: Diagnosis not present

## 2022-09-30 DIAGNOSIS — R519 Headache, unspecified: Secondary | ICD-10-CM | POA: Diagnosis present

## 2022-09-30 LAB — CBC WITH DIFFERENTIAL/PLATELET
Abs Immature Granulocytes: 0.04 10*3/uL (ref 0.00–0.07)
Basophils Absolute: 0 10*3/uL (ref 0.0–0.1)
Basophils Relative: 0 %
Eosinophils Absolute: 0.1 10*3/uL (ref 0.0–0.5)
Eosinophils Relative: 1 %
HCT: 43.2 % (ref 39.0–52.0)
Hemoglobin: 13.9 g/dL (ref 13.0–17.0)
Immature Granulocytes: 0 %
Lymphocytes Relative: 13 %
Lymphs Abs: 1.2 10*3/uL (ref 0.7–4.0)
MCH: 29.3 pg (ref 26.0–34.0)
MCHC: 32.2 g/dL (ref 30.0–36.0)
MCV: 90.9 fL (ref 80.0–100.0)
Monocytes Absolute: 0.7 10*3/uL (ref 0.1–1.0)
Monocytes Relative: 8 %
Neutro Abs: 7 10*3/uL (ref 1.7–7.7)
Neutrophils Relative %: 78 %
Platelets: 202 10*3/uL (ref 150–400)
RBC: 4.75 MIL/uL (ref 4.22–5.81)
RDW: 14.3 % (ref 11.5–15.5)
WBC: 9.1 10*3/uL (ref 4.0–10.5)
nRBC: 0 % (ref 0.0–0.2)

## 2022-09-30 LAB — BASIC METABOLIC PANEL
Anion gap: 11 (ref 5–15)
BUN: 15 mg/dL (ref 8–23)
CO2: 24 mmol/L (ref 22–32)
Calcium: 8.9 mg/dL (ref 8.9–10.3)
Chloride: 105 mmol/L (ref 98–111)
Creatinine, Ser: 1.05 mg/dL (ref 0.61–1.24)
GFR, Estimated: >60 mL/min (ref >60)
Glucose, Bld: 144 mg/dL — ABNORMAL HIGH (ref 70–99)
Potassium: 4 mmol/L (ref 3.5–5.1)
Sodium: 140 mmol/L (ref 135–145)

## 2022-09-30 LAB — CBG MONITORING, ED: Glucose-Capillary: 133 mg/dL — ABNORMAL HIGH (ref 70–99)

## 2022-09-30 MED ORDER — CLINDAMYCIN HCL 150 MG PO CAPS: 450.0000 mg | ORAL_CAPSULE | Freq: Three times a day (TID) | ORAL | 0 refills | Status: AC

## 2022-09-30 MED ORDER — LIDOCAINE-EPINEPHRINE (PF) 2 %-1:200000 IJ SOLN
10.0000 mL | Freq: Once | INTRAMUSCULAR | Status: AC
  Administered 2022-09-30: 10 mL
  Filled 2022-09-30: qty 20

## 2022-09-30 MED ORDER — FENTANYL CITRATE PF 50 MCG/ML IJ SOSY
50.0000 ug | PREFILLED_SYRINGE | Freq: Once | INTRAMUSCULAR | Status: AC
  Administered 2022-09-30: 50 ug via INTRAVENOUS
  Filled 2022-09-30: qty 1

## 2022-09-30 MED ORDER — HYDROCODONE-ACETAMINOPHEN 5-325 MG PO TABS: 1.0000 | ORAL_TABLET | Freq: Four times a day (QID) | ORAL | 0 refills | Status: AC | PRN

## 2022-09-30 MED ORDER — IOHEXOL 300 MG/ML  SOLN
100.0000 mL | Freq: Once | INTRAMUSCULAR | Status: AC | PRN
  Administered 2022-09-30: 75 mL via INTRAVENOUS

## 2022-09-30 NOTE — Discharge Instructions (Addendum)
Your symptoms and CT imaging are consistent with an abscess of the tooth that has spread to your hard palate.  Please follow-up with a dentist as you may need to have a tooth pulled.  We will place you on Norco for pain control and clindamycin for antibiotics.

## 2022-09-30 NOTE — ED Notes (Signed)
Discharge instructions discussed with pt. Pt verbalized understanding. Pt stable and ambulatory.  °

## 2022-09-30 NOTE — ED Provider Notes (Signed)
[facility] EMERGENCY DEPT Provider Note   CSN: [id number] Arrival date & time: 09/30/22  1042     History  Chief Complaint  Patient presents with   Sinus Problem    Ian Branch is a 71 y.o. male.   Sinus Problem     71 year old male with medical history significant for HLD, HTN, DM 2 who presents to the emergency department with sinus pain and congestion.  The patient thinks he might have a sinus infection.  He states that he has had sinus pressure and pain for the last 4 days.  He has been taking Tylenol.  He denies any fevers or chills.  He denies any cough or shortness of breath.  He endorses swelling in the hard palate of his mouth, nasal congestion.   Home Medications Prior to Admission medications   Medication Sig Start Date End Date Taking? Authorizing Provider  acetaminophen (TYLENOL) 500 MG tablet Take 500 mg by mouth every 6 (six) hours as needed for moderate pain or fever.    [provider]  amLODipine (NORVASC) 2.5 MG tablet Take 2.5 mg by mouth daily.    [provider]  atorvastatin (LIPITOR) 20 MG tablet Take 20 mg by mouth daily.    [provider]  dapagliflozin propanediol (FARXIGA) 5 MG TABS tablet Take 5 mg by mouth daily.    [provider]  enoxaparin (LOVENOX) 40 MG/0.4ML injection Inject 40 mg into the skin daily.    [provider]  ezetimibe (ZETIA) 10 MG tablet Take 10 mg by mouth daily.    [provider]  gabapentin (NEURONTIN) 100 MG capsule Take 100 mg by mouth 3 (three) times daily as needed (pain).    [provider]  losartan (COZAAR) 25 MG tablet Take 25 mg by mouth daily.    [provider]  metFORMIN (GLUCOPHAGE) 1000 MG tablet Take 1,000 mg by mouth 2 (two) times daily with a meal.    [provider]  oxyCODONE (OXY IR/ROXICODONE) 5 MG immediate release tablet Take 5 mg by mouth every 6 (six) hours as needed for severe pain.    [provider]  polyethylene glycol (MIRALAX / GLYCOLAX) 17 g packet Take 17 g by mouth daily as needed for moderate constipation.    [provider]  sennosides-docusate sodium (SENOKOT-S) 8.6-50 MG tablet Take 2 tablets by mouth in the morning and at bedtime.    [provider]      Allergies    Semaglutide    Review of Systems   Review of Systems  All other systems reviewed and are negative.   Physical Exam Updated Vital Signs BP (!) 152/73   Pulse 64   Temp 98.6 F (37 C) (Oral)   Resp 16   SpO2 95%  Physical Exam Vitals and nursing note reviewed.  Constitutional:      General: He is not in acute distress. HENT:     Head: Normocephalic and atraumatic.     Comments: Bilateral maxillary sinus tenderness, mass along the hard palate anteriorly Eyes:     Conjunctiva/sclera: Conjunctivae normal.     Pupils: Pupils are equal, round, and reactive to light.  Cardiovascular:     Rate and Rhythm: Normal rate and regular rhythm.  Pulmonary:     Effort: Pulmonary effort is normal. No respiratory distress.  Abdominal:     General: There is no distension.     Tenderness: There is no guarding.  Musculoskeletal:  General: No deformity or signs of injury.     Cervical back: Neck supple.  Skin:    Findings: No lesion or rash.  Neurological:     General: No focal deficit present.     Mental Status: He is alert. Mental status is at baseline.     ED Results / Procedures / Treatments   Labs (all labs ordered are listed, but only abnormal results are displayed) Labs Reviewed  BASIC METABOLIC PANEL - Abnormal; Notable for the following components:      Result Value   Glucose, Bld 144 (*)    All other components within normal limits  CBG MONITORING, ED - Abnormal; Notable for the following components:   Glucose-Capillary 133 (*)    All other components within normal limits  CBC WITH DIFFERENTIAL/PLATELET    EKG None  Radiology CT Maxillofacial W  Contrast  Result Date: 09/30/2022 CLINICAL DATA:  Sinus pain and pressure for 4 days. EXAM: CT MAXILLOFACIAL WITH CONTRAST TECHNIQUE: Multidetector CT images of the paranasal sinuses was performed according to the standard protocol following intravenous contrast administration. RADIATION DOSE REDUCTION: This exam was performed according to the departmental dose-optimization program which includes automated exposure control, adjustment of the mA and/or kV according to patient size and/or use of iterative reconstruction technique. CONTRAST:  50mL OMNIPAQUE IOHEXOL 300 MG/ML  SOLN COMPARISON:  None Available. FINDINGS: Paranasal sinuses: Frontal: Normally aerated. Patent frontal sinus drainage pathways. Ethmoid: Normally aerated. Maxillary: There is mild mucosal thickening along the floors of the maxillary sinuses. There is no layering fluid level. Sphenoid: Normally aerated. Patent sphenoethmoidal recesses. Right ostiomeatal unit: Patent. Left ostiomeatal unit: Patent. Nasal passages: Patent. There is mild leftward nasal septal deviation with a small leftward spur. Other: The globes and orbits are unremarkable. The imaged portions of the intracranial compartment are unremarkable. There is periapical lucency around multiple maxillary teeth, most notably around the root of the right canine which protrudes through the adjacent maxillary cortex and is likely associated with the 1.9 cm x 1.2 cm hypodense lesion along the right hard palate (6-63). Numerous dental caries are also seen. IMPRESSION: 1. Mild mucosal thickening along the floors of the maxillary sinuses with no layering fluid. Otherwise clear sinuses with patent drainage pathways. 2. Significant dental disease with periapical lucency most notably around the root of the right maxillary canine which breaks through the adjacent maxillary cortex and is contiguous with a hypodense lesion along the right hard palate measuring up to 1.9 cm which could reflect an  abscess. Recommend correlation with direct visualization. Electronically Signed   By: Lesia Hausen M.D.   On: 09/30/2022 15:29    Procedures .Marland KitchenIncision and Drainage  Date/Time: 09/30/2022 3:53 PM  Performed by: Ernie Avena, MD Authorized by: Ernie Avena, MD   Consent:    Consent obtained:  Verbal   Consent given by:  Patient   Risks discussed:  Bleeding, incomplete drainage and pain   Alternatives discussed:  No treatment Universal protocol:    Patient identity confirmed:  Verbally with patient Location:    Type:  Abscess   Size:  1cm   Location:  Mouth   Mouth location:  Palate Sedation:    Sedation type:  None Anesthesia:    Anesthesia method:  Local infiltration   Local anesthetic:  Lidocaine 1% WITH epi Procedure type:    Complexity:  Simple Procedure details:    Needle aspiration: yes     Needle size:  25 G   Drainage:  Bloody  Drainage amount:  Scant   Wound treatment:  Wound left open Post-procedure details:    Procedure completion:  Tolerated     Medications Ordered in ED Medications  lidocaine-EPINEPHrine (XYLOCAINE W/EPI) 2 %-1:200000 (PF) injection 10 mL (has no administration in time range)  fentaNYL (SUBLIMAZE) injection 50 mcg (50 mcg Intravenous Given 09/30/22 1329)  iohexol (OMNIPAQUE) 300 MG/ML solution 100 mL (75 mLs Intravenous Contrast Given 09/30/22 1424)    ED Course/ Medical Decision Making/ A&P                           Medical Decision Making Amount and/or Complexity of Data Reviewed Labs: ordered. Radiology: ordered.  Risk Prescription drug management.     71 year old male with medical history significant for HLD, HTN, DM 2 who presents to the emergency department with sinus pain and congestion.  The patient thinks he might have a sinus infection.  He states that he has had sinus pressure and pain for the last 4 days.  He has been taking Tylenol.  He denies any fevers or chills.  He denies any cough or shortness of breath.   He endorses swelling in the hard palate of his mouth, nasal congestion.   Arrival, the patient was vitally stable, afebrile, not tachycardic or tachypneic, hypertensive BP 174/91, saturating 97% on room air.  Physical exam concerning for a mass along the hard palate, bilateral maxillary sinus tenderness.  Differential diagnosis includes sinusitis, viral or bacterial, facial mass such as tumor, facial abscess.  IV access obtained and screening labs were obtained.  CT face with contrast was ordered.  Laboratory evaluation significant for CBC without leukocytosis or anemia, BMP unremarkable, CBG 133.  The patient was administered fentanyl for pain control.  CT face with contrast was performed which revealed evidence of a periapical abscess and spread of abscess to the hard palate. IMPRESSION:  1. Mild mucosal thickening along the floors of the maxillary sinuses  with no layering fluid. Otherwise clear sinuses with patent drainage  pathways.  2. Significant dental disease with periapical lucency most notably  around the root of the right maxillary canine which breaks through  the adjacent maxillary cortex and is contiguous with a hypodense  lesion along the right hard palate measuring up to 1.9 cm which  could reflect an abscess. Recommend correlation with direct  visualization.    Abscess I&D was performed although the patient's abscess had mostly ruptured while he was in CT.  We will place the patient on clindamycin.  He has a Education officer, community he can follow-up with.  Plan for outpatient dental follow-up.  Norco provided for pain control, clindamycin prescribed for antibiotics.  Stable for discharge.   Final Clinical Impression(s) / ED Diagnoses Final diagnoses:  Periapical abscess with facial involvement    Rx / DC Orders ED Discharge Orders          Ordered    Ambulatory referral to Dentistry        09/30/22 1542              Ernie Avena, MD 09/30/22 1604

## 2022-09-30 NOTE — ED Triage Notes (Signed)
Patient arrives ambulatory to triage with complaints of sinus pressure and pain x4 days. Patient took tylenol prior to arrival. Pain is at a 9/10.
